# Patient Record
Sex: Male | Born: 1995 | Race: White | Hispanic: No | Marital: Single | Smoking: Former smoker
Health system: Southern US, Community
[De-identification: ages and names within clinical notes are randomized; demographics above are authoritative.]

## PROBLEM LIST (undated history)

## (undated) DIAGNOSIS — F419 Anxiety disorder, unspecified: Secondary | ICD-10-CM

## (undated) DIAGNOSIS — F32A Depression, unspecified: Secondary | ICD-10-CM

## (undated) DIAGNOSIS — F329 Major depressive disorder, single episode, unspecified: Secondary | ICD-10-CM

## (undated) DIAGNOSIS — F319 Bipolar disorder, unspecified: Secondary | ICD-10-CM

## (undated) HISTORY — DX: Bipolar disorder, unspecified: F31.9

## (undated) HISTORY — DX: Depression, unspecified: F32.A

## (undated) HISTORY — PX: KNEE SURGERY: SHX244

## (undated) HISTORY — DX: Anxiety disorder, unspecified: F41.9

## (undated) HISTORY — DX: Major depressive disorder, single episode, unspecified: F32.9

---
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
## 2017-07-02 ENCOUNTER — Emergency Department (HOSPITAL_BASED_OUTPATIENT_CLINIC_OR_DEPARTMENT_OTHER)
Admission: EM | Admit: 2017-07-02 | Discharge: 2017-07-02 | Disposition: A | Discharge status: Home / Self Care | Payer: BLUE CROSS/BLUE SHIELD | Attending: Emergency Medicine | Admitting: Emergency Medicine

## 2017-07-02 ENCOUNTER — Other Ambulatory Visit: Payer: Self-pay

## 2017-07-02 ENCOUNTER — Encounter (HOSPITAL_BASED_OUTPATIENT_CLINIC_OR_DEPARTMENT_OTHER): Payer: Self-pay | Admitting: Emergency Medicine

## 2017-07-02 ENCOUNTER — Emergency Department (HOSPITAL_BASED_OUTPATIENT_CLINIC_OR_DEPARTMENT_OTHER): Payer: BLUE CROSS/BLUE SHIELD

## 2017-07-02 DIAGNOSIS — Z87891 Personal history of nicotine dependence: Secondary | ICD-10-CM | POA: Diagnosis not present

## 2017-07-02 DIAGNOSIS — R112 Nausea with vomiting, unspecified: Secondary | ICD-10-CM | POA: Insufficient documentation

## 2017-07-02 DIAGNOSIS — R1013 Epigastric pain: Secondary | ICD-10-CM | POA: Diagnosis not present

## 2017-07-02 LAB — CBC
HCT: 44.6 % (ref 39.0–52.0)
Hemoglobin: 15.2 g/dL (ref 13.0–17.0)
MCH: 29.1 pg (ref 26.0–34.0)
MCHC: 34.1 g/dL (ref 30.0–36.0)
MCV: 85.3 fL (ref 78.0–100.0)
PLATELETS: 344 10*3/uL (ref 150–400)
RBC: 5.23 MIL/uL (ref 4.22–5.81)
RDW: 13.3 % (ref 11.5–15.5)
WBC: 6.3 10*3/uL (ref 4.0–10.5)

## 2017-07-02 LAB — URINALYSIS, MICROSCOPIC (REFLEX): RBC / HPF: NONE SEEN RBC/hpf (ref 0–5)

## 2017-07-02 LAB — URINALYSIS, ROUTINE W REFLEX MICROSCOPIC
Bilirubin Urine: NEGATIVE
GLUCOSE, UA: NEGATIVE mg/dL
Hgb urine dipstick: NEGATIVE
KETONES UR: NEGATIVE mg/dL
Leukocytes, UA: NEGATIVE
NITRITE: NEGATIVE
Protein, ur: 30 mg/dL — AB
Specific Gravity, Urine: 1.01 (ref 1.005–1.030)
pH: 8.5 — ABNORMAL HIGH (ref 5.0–8.0)

## 2017-07-02 LAB — COMPREHENSIVE METABOLIC PANEL
ALT: 12 U/L — AB (ref 17–63)
AST: 16 U/L (ref 15–41)
Albumin: 4.9 g/dL (ref 3.5–5.0)
Alkaline Phosphatase: 85 U/L (ref 38–126)
Anion gap: 9 (ref 5–15)
BILIRUBIN TOTAL: 0.7 mg/dL (ref 0.3–1.2)
BUN: 9 mg/dL (ref 6–20)
CO2: 26 mmol/L (ref 22–32)
CREATININE: 0.95 mg/dL (ref 0.61–1.24)
Calcium: 9.2 mg/dL (ref 8.9–10.3)
Chloride: 104 mmol/L (ref 101–111)
Glucose, Bld: 88 mg/dL (ref 65–99)
Potassium: 4.1 mmol/L (ref 3.5–5.1)
Sodium: 139 mmol/L (ref 135–145)
TOTAL PROTEIN: 7.6 g/dL (ref 6.5–8.1)

## 2017-07-02 LAB — LIPASE, BLOOD: Lipase: 19 U/L (ref 11–51)

## 2017-07-02 MED ORDER — SUCRALFATE 1 GM/10ML PO SUSP: 1.0000 g | Freq: Three times a day (TID) | ORAL | 0 refills | Status: AC

## 2017-07-02 MED ORDER — FAMOTIDINE 20 MG PO TABS
20.0000 mg | ORAL_TABLET | Freq: Once | ORAL | Status: AC
  Administered 2017-07-02: 20 mg via ORAL
  Filled 2017-07-02: qty 1

## 2017-07-02 MED ORDER — PANTOPRAZOLE SODIUM 40 MG IV SOLR
40.0000 mg | Freq: Once | INTRAVENOUS | Status: DC
Start: 2017-07-02 — End: 2017-07-02

## 2017-07-02 MED ORDER — ONDANSETRON 4 MG PO TBDP: 4.0000 mg | ORAL_TABLET | Freq: Three times a day (TID) | ORAL | 0 refills | Status: AC | PRN

## 2017-07-02 MED ORDER — PANTOPRAZOLE SODIUM 20 MG PO TBEC: 20.0000 mg | DELAYED_RELEASE_TABLET | Freq: Every day | ORAL | 0 refills | Status: AC

## 2017-07-02 MED ORDER — FAMOTIDINE IN NACL 20-0.9 MG/50ML-% IV SOLN: 20.0000 mg | Freq: Once | INTRAVENOUS | Status: DC

## 2017-07-02 MED ORDER — GI COCKTAIL ~~LOC~~
30.0000 mL | Freq: Once | ORAL | Status: AC
  Administered 2017-07-02: 30 mL via ORAL
  Filled 2017-07-02: qty 30

## 2017-07-02 MED ORDER — PANTOPRAZOLE SODIUM 40 MG PO TBEC
40.0000 mg | DELAYED_RELEASE_TABLET | Freq: Once | ORAL | Status: AC
  Administered 2017-07-02: 40 mg via ORAL
  Filled 2017-07-02: qty 1

## 2017-07-02 MED ORDER — ONDANSETRON 4 MG PO TBDP
4.0000 mg | ORAL_TABLET | Freq: Once | ORAL | Status: AC
  Administered 2017-07-02: 4 mg via ORAL
  Filled 2017-07-02: qty 1

## 2017-07-02 NOTE — ED Notes (Signed)
Refuses IV access. EDP notified.

## 2017-07-02 NOTE — Discharge Instructions (Signed)
Your abdominal pain is likely from gastritis, reflux or a stomach ulcer. You will need to take the prescribed proton pump inhibitor as directed, and avoid spicy/fatty/acidic foods. Avoid laying down flat within 30 minutes of eating. Avoid NSAIDs like ibuprofen or Aleve on an empty stomach. Use zofran as needed for nausea. Follow up with the gastroenterologist (GI doctor) listed for ongoing evaluation of your abdominal pain. Return to the ER for new or worsening symptoms, any additional concers. ° ° °SEEK IMMEDIATE MEDICAL ATTENTION IF YOU DEVELOP ANY OF THE FOLLOWING SYMPTOMS: °The pain does not go away or becomes severe.  °A temperature above 101 develops.  °Repeated vomiting occurs (multiple episodes).  °Blood is being passed in stools or vomit (bright red or black tarry stools).  °Return also if you develop chest pain, difficulty breathing, dizziness or fainting ° °

## 2017-07-02 NOTE — ED Provider Notes (Signed)
[facility] EMERGENCY DEPARTMENT Provider Note   CSN: [id number] Arrival date & time: 07/02/17  1425     History   Chief Complaint Chief Complaint  Patient presents with  . Abdominal Pain    HPI Carlos Ochoa is a 22 y.o. male.  HPI 22 year old male past medical history significant for anxiety, bipolar disorder, depression presents emergency department today for evaluation of abdominal cramping, vomiting.  Patient states that he has been having generalized abdominal cramping for the past 2 weeks that are intermittent.  Food makes the pain worse.  Patient states that this morning he was at work and had an episode of vomiting with blood in it.  Patient denies any further episodes of vomiting today.  Denies any associated diarrhea or constipation.  No history of same.  Patient has been taking over-the-counter analgesics such as Tylenol Motrin for the pain with some relief.  Patient denies any urinary symptoms, fevers, recent travel, bloody stools.  Last bowel movement was 2 days ago.  He does report passing gas.  Patient has not taking medications for his symptoms prior to arrival.  Denies any associated testicular pain or swelling.  Denies any chronic NSAID use.  Patient does endorse drinking last night.  Pt denies any fever, chill, ha, vision changes, lightheadedness, dizziness, congestion, neck pain, cp, sob, cough,urinary symptoms,  melena, hematochezia, lower extremity paresthesias.  Past Medical History:  Diagnosis Date  . Anxiety   . Bipolar disorder (HCC)   . Depression     There are no active problems to display for this patient.   Past Surgical History:  Procedure Laterality Date  . KNEE SURGERY         Home Medications    Prior to Admission medications   Not on File    Family History Family History  Problem Relation Age of Onset  . Anxiety disorder Mother   . Bipolar disorder Mother   . Depression Mother     Social History Social History    Tobacco Use  . Smoking status: Former Smoker    Types: Cigarettes    Last attempt to quit: 09/30/2014    Years since quitting: 2.7  . Smokeless tobacco: Never Used  Substance Use Topics  . Alcohol use: No  . Drug use: Yes    Types: Marijuana     Allergies   Patient has no known allergies.   Review of Systems Review of Systems  Constitutional: Negative for chills and fever.  HENT: Negative for congestion and sore throat.   Eyes: Negative for visual disturbance.  Respiratory: Negative for cough and shortness of breath.   Cardiovascular: Negative for chest pain.  Gastrointestinal: Positive for abdominal pain, nausea and vomiting. Negative for blood in stool, constipation and diarrhea.  Genitourinary: Negative for dysuria, flank pain, frequency, hematuria, scrotal swelling, testicular pain and urgency.  Musculoskeletal: Negative for arthralgias and myalgias.  Skin: Negative for rash.  Neurological: Negative for dizziness, syncope, weakness, light-headedness, numbness and headaches.  Psychiatric/Behavioral: Negative for sleep disturbance. The patient is not nervous/anxious.      Physical Exam Updated Vital Signs BP 137/78 (BP Location: Right Arm)   Pulse 72   Temp 98.2 F (36.8 C) (Oral)   Resp 18   Ht 5\' 10"  (1.778 m)   Wt 68 kg (150 lb)   SpO2 98%   BMI 21.52 kg/m   Physical Exam  Constitutional: He is oriented to person, place, and time. He appears well-developed and well-nourished.  Non-toxic appearance. No  distress.  HENT:  Head: Normocephalic and atraumatic.  Mouth/Throat: Oropharynx is clear and moist.  Eyes: Conjunctivae are normal. Pupils are equal, round, and reactive to light. Right eye exhibits no discharge. Left eye exhibits no discharge.  Neck: Normal range of motion. Neck supple.  Cardiovascular: Normal rate, regular rhythm, normal heart sounds and intact distal pulses. Exam reveals no gallop and no friction rub.  No murmur heard. Pulmonary/Chest:  Effort normal and breath sounds normal. No respiratory distress. He exhibits no tenderness.  Abdominal: Soft. Bowel sounds are normal. He exhibits no distension. There is tenderness in the epigastric area and left upper quadrant. There is no rigidity, no rebound, no guarding, no CVA tenderness, no tenderness at McBurney's point and negative Murphy's sign.  Musculoskeletal: Normal range of motion. He exhibits no tenderness.  Lymphadenopathy:    He has no cervical adenopathy.  Neurological: He is alert and oriented to person, place, and time.  Skin: Skin is warm and dry. Capillary refill takes less than 2 seconds. No rash noted.  Psychiatric: His behavior is normal. Judgment and thought content normal.  Nursing note and vitals reviewed.    ED Treatments / Results  Labs (all labs ordered are listed, but only abnormal results are displayed) Labs Reviewed  COMPREHENSIVE METABOLIC PANEL - Abnormal; Notable for the following components:      Result Value   ALT 12 (*)    All other components within normal limits  URINALYSIS, ROUTINE W REFLEX MICROSCOPIC - Abnormal; Notable for the following components:   pH 8.5 (*)    Protein, ur 30 (*)    All other components within normal limits  URINALYSIS, MICROSCOPIC (REFLEX) - Abnormal; Notable for the following components:   Bacteria, UA RARE (*)    Squamous Epithelial / LPF 0-5 (*)    All other components within normal limits  LIPASE, BLOOD  CBC    EKG  EKG Interpretation None       Radiology No results found.  Procedures Procedures (including critical care time)  Medications Ordered in ED Medications  ondansetron (ZOFRAN-ODT) disintegrating tablet 4 mg (4 mg Oral Given 07/02/17 1447)  gi cocktail (Maalox,Lidocaine,Donnatal) (30 mLs Oral Given 07/02/17 1952)  famotidine (PEPCID) tablet 20 mg (20 mg Oral Given 07/02/17 1950)  pantoprazole (PROTONIX) EC tablet 40 mg (40 mg Oral Given 07/02/17 1950)     Initial Impression / Assessment  and Plan / ED Course  I have reviewed the triage vital signs and the nursing notes.  Pertinent labs & imaging results that were available during my care of the patient were reviewed by me and considered in my medical decision making (see chart for details).     Patient with symptoms consistent with viral gastroenteritis versus PUD versus alcoholic gastritis.  Vitals are stable, no fever.  No signs of dehydration, tolerating PO fluids > 6 oz.  Lungs are clear.  No focal abdominal pain, no concern for appendicitis, cholecystitis, pancreatitis, ruptured viscus, UTI, kidney stone, or any other abdominal etiology.  Work reassuring.  No leukocytosis.  Normal liver enzymes.  Normal lipase.  UA shows no signs of infection.  Normal kidney function.  I offered patient GI cocktail, IV Pepcid, Protonix.  Patient refused all IV medications.  He refused imaging.  Patient wants to be discharged at this time.  States that he may benefit from further workup here or with GI.  Patient states that he would rather go home and will follow with the GI doctor in the outpatient setting.  Supportive therapy indicated with return if symptoms worsen.   Pt is hemodynamically stable, in NAD, & able to ambulate in the ED. Evaluation does not show pathology that would require ongoing emergent intervention or inpatient treatment. I explained the diagnosis to the patient. Pain has been managed & has no complaints prior to dc. Pt is comfortable with above plan and is stable for discharge at this time. All questions were answered prior to disposition. Strict return precautions for f/u to the ED were discussed. Encouraged follow up with PCP.    Final Clinical Impressions(s) / ED Diagnoses   Final diagnoses:  Epigastric abdominal pain  Non-intractable vomiting with nausea, unspecified vomiting type    ED Discharge Orders        Ordered    pantoprazole (PROTONIX) 20 MG tablet  Daily     07/02/17 2022    sucralfate (CARAFATE) 1  GM/10ML suspension  3 times daily with meals & bedtime     07/02/17 2022    ondansetron (ZOFRAN ODT) 4 MG disintegrating tablet  Every 8 hours PRN     07/02/17 2022       Wallace Keller 07/04/17 0204    Terrilee Files, MD 07/05/17 [phone]

## 2017-07-02 NOTE — ED Triage Notes (Signed)
Intermittent abd cramping x 2 weeks. Pt states he has been vomiting today.

## 2017-07-02 NOTE — ED Notes (Signed)
EMT called patient to re vital. EMT waited about 10 mins before patient came in to the traige room. Patient stated " My heart rate is going to be high, I just ran in here because I drove to my moms house to eat lunch with her". EMT asked patient if he still had abdominal pain. Patient stated " Yes, it hurts bad".

## 2019-10-23 ENCOUNTER — Emergency Department (HOSPITAL_COMMUNITY): Payer: BC Managed Care – PPO

## 2019-10-23 ENCOUNTER — Encounter (HOSPITAL_COMMUNITY): Payer: Self-pay

## 2019-10-23 ENCOUNTER — Emergency Department (HOSPITAL_COMMUNITY)
Admission: EM | Admit: 2019-10-23 | Discharge: 2019-10-23 | Disposition: A | Discharge status: Home / Self Care | Payer: BC Managed Care – PPO | Attending: Emergency Medicine | Admitting: Emergency Medicine

## 2019-10-23 DIAGNOSIS — S60512A Abrasion of left hand, initial encounter: Secondary | ICD-10-CM | POA: Diagnosis not present

## 2019-10-23 DIAGNOSIS — S62111A Displaced fracture of triquetrum [cuneiform] bone, right wrist, initial encounter for closed fracture: Secondary | ICD-10-CM | POA: Diagnosis not present

## 2019-10-23 DIAGNOSIS — Z87891 Personal history of nicotine dependence: Secondary | ICD-10-CM | POA: Insufficient documentation

## 2019-10-23 DIAGNOSIS — S42002A Fracture of unspecified part of left clavicle, initial encounter for closed fracture: Secondary | ICD-10-CM | POA: Insufficient documentation

## 2019-10-23 DIAGNOSIS — S60511A Abrasion of right hand, initial encounter: Secondary | ICD-10-CM | POA: Insufficient documentation

## 2019-10-23 DIAGNOSIS — S62344A Nondisplaced fracture of base of fourth metacarpal bone, right hand, initial encounter for closed fracture: Secondary | ICD-10-CM | POA: Insufficient documentation

## 2019-10-23 DIAGNOSIS — Y939 Activity, unspecified: Secondary | ICD-10-CM | POA: Insufficient documentation

## 2019-10-23 DIAGNOSIS — M546 Pain in thoracic spine: Secondary | ICD-10-CM | POA: Insufficient documentation

## 2019-10-23 DIAGNOSIS — Z23 Encounter for immunization: Secondary | ICD-10-CM | POA: Insufficient documentation

## 2019-10-23 DIAGNOSIS — S20219A Contusion of unspecified front wall of thorax, initial encounter: Secondary | ICD-10-CM | POA: Insufficient documentation

## 2019-10-23 DIAGNOSIS — S62346A Nondisplaced fracture of base of fifth metacarpal bone, right hand, initial encounter for closed fracture: Secondary | ICD-10-CM | POA: Diagnosis not present

## 2019-10-23 DIAGNOSIS — R040 Epistaxis: Secondary | ICD-10-CM | POA: Diagnosis not present

## 2019-10-23 DIAGNOSIS — Y999 Unspecified external cause status: Secondary | ICD-10-CM | POA: Diagnosis not present

## 2019-10-23 DIAGNOSIS — Y929 Unspecified place or not applicable: Secondary | ICD-10-CM | POA: Diagnosis not present

## 2019-10-23 DIAGNOSIS — S62141A Displaced fracture of body of hamate [unciform] bone, right wrist, initial encounter for closed fracture: Secondary | ICD-10-CM | POA: Diagnosis not present

## 2019-10-23 DIAGNOSIS — T07XXXA Unspecified multiple injuries, initial encounter: Secondary | ICD-10-CM | POA: Diagnosis present

## 2019-10-23 DIAGNOSIS — T1490XA Injury, unspecified, initial encounter: Secondary | ICD-10-CM

## 2019-10-23 LAB — CBC WITH DIFFERENTIAL/PLATELET
Abs Immature Granulocytes: 0.05 10*3/uL (ref 0.00–0.07)
Basophils Absolute: 0 10*3/uL (ref 0.0–0.1)
Basophils Relative: 0 %
Eosinophils Absolute: 0 10*3/uL (ref 0.0–0.5)
Eosinophils Relative: 0 %
HCT: 46.1 % (ref 39.0–52.0)
Hemoglobin: 15.3 g/dL (ref 13.0–17.0)
Immature Granulocytes: 1 %
Lymphocytes Relative: 11 %
Lymphs Abs: 1.1 10*3/uL (ref 0.7–4.0)
MCH: 29.5 pg (ref 26.0–34.0)
MCHC: 33.2 g/dL (ref 30.0–36.0)
MCV: 89 fL (ref 80.0–100.0)
Monocytes Absolute: 1 10*3/uL (ref 0.1–1.0)
Monocytes Relative: 10 %
Neutro Abs: 8.2 10*3/uL — ABNORMAL HIGH (ref 1.7–7.7)
Neutrophils Relative %: 78 %
Platelets: 307 10*3/uL (ref 150–400)
RBC: 5.18 MIL/uL (ref 4.22–5.81)
RDW: 12.6 % (ref 11.5–15.5)
WBC: 10.5 10*3/uL (ref 4.0–10.5)
nRBC: 0 % (ref 0.0–0.2)

## 2019-10-23 LAB — I-STAT CHEM 8, ED
BUN: 9 mg/dL (ref 6–20)
Calcium, Ion: 1.09 mmol/L — ABNORMAL LOW (ref 1.15–1.40)
Chloride: 109 mmol/L (ref 98–111)
Creatinine, Ser: 1.2 mg/dL (ref 0.61–1.24)
Glucose, Bld: 99 mg/dL (ref 70–99)
HCT: 45 % (ref 39.0–52.0)
Hemoglobin: 15.3 g/dL (ref 13.0–17.0)
Potassium: 3.5 mmol/L (ref 3.5–5.1)
Sodium: 148 mmol/L — ABNORMAL HIGH (ref 135–145)
TCO2: 21 mmol/L — ABNORMAL LOW (ref 22–32)

## 2019-10-23 LAB — COMPREHENSIVE METABOLIC PANEL
ALT: 24 U/L (ref 0–44)
AST: 34 U/L (ref 15–41)
Albumin: 4.7 g/dL (ref 3.5–5.0)
Alkaline Phosphatase: 70 U/L (ref 38–126)
Anion gap: 14 (ref 5–15)
BUN: 9 mg/dL (ref 6–20)
CO2: 20 mmol/L — ABNORMAL LOW (ref 22–32)
Calcium: 9.6 mg/dL (ref 8.9–10.3)
Chloride: 110 mmol/L (ref 98–111)
Creatinine, Ser: 1.04 mg/dL (ref 0.61–1.24)
GFR calc Af Amer: >60 mL/min (ref >60)
GFR calc non Af Amer: >60 mL/min (ref >60)
Glucose, Bld: 104 mg/dL — ABNORMAL HIGH (ref 70–99)
Potassium: 3.5 mmol/L (ref 3.5–5.1)
Sodium: 144 mmol/L (ref 135–145)
Total Bilirubin: 0.5 mg/dL (ref 0.3–1.2)
Total Protein: 7.4 g/dL (ref 6.5–8.1)

## 2019-10-23 LAB — SAMPLE TO BLOOD BANK

## 2019-10-23 MED ORDER — TETANUS-DIPHTH-ACELL PERTUSSIS 5-2.5-18.5 LF-MCG/0.5 IM SUSP
0.5000 mL | Freq: Once | INTRAMUSCULAR | Status: AC
  Administered 2019-10-23: 0.5 mL via INTRAMUSCULAR
  Filled 2019-10-23: qty 0.5

## 2019-10-23 MED ORDER — MORPHINE SULFATE (PF) 4 MG/ML IV SOLN
4.0000 mg | Freq: Once | INTRAVENOUS | Status: AC
  Administered 2019-10-23: 4 mg via INTRAVENOUS
  Filled 2019-10-23: qty 1

## 2019-10-23 MED ORDER — CYCLOBENZAPRINE HCL 10 MG PO TABS
10.0000 mg | ORAL_TABLET | Freq: Once | ORAL | Status: AC
  Administered 2019-10-23: 10 mg via ORAL
  Filled 2019-10-23: qty 1

## 2019-10-23 MED ORDER — FENTANYL CITRATE (PF) 100 MCG/2ML IJ SOLN
50.0000 ug | Freq: Once | INTRAMUSCULAR | Status: AC
  Administered 2019-10-23: 50 ug via INTRAVENOUS
  Filled 2019-10-23: qty 2

## 2019-10-23 MED ORDER — HYDROCODONE-ACETAMINOPHEN 5-325 MG PO TABS: 1.0000 | ORAL_TABLET | Freq: Four times a day (QID) | ORAL | 0 refills | Status: AC | PRN

## 2019-10-23 MED ORDER — CYCLOBENZAPRINE HCL 10 MG PO TABS: 10.0000 mg | ORAL_TABLET | Freq: Two times a day (BID) | ORAL | 0 refills | Status: AC | PRN

## 2019-10-23 MED ORDER — IBUPROFEN 600 MG PO TABS: 600.0000 mg | ORAL_TABLET | Freq: Four times a day (QID) | ORAL | 0 refills | Status: AC | PRN

## 2019-10-23 MED ORDER — IOHEXOL 300 MG/ML  SOLN
100.0000 mL | Freq: Once | INTRAMUSCULAR | Status: AC | PRN
  Administered 2019-10-23: 100 mL via INTRAVENOUS
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
## 2019-10-23 NOTE — Discharge Instructions (Addendum)
1. Medications: Alternate 600 mg of ibuprofen and [phone] mg of Tylenol every 3-6 hours as needed for pain. Do not exceed 4000 mg of Tylenol daily.  Take ibuprofen with food to avoid upset stomach issues. You can take hydrocodone as needed for severe pain but do not drive, drink alcohol, or operate heavy machinery while taking this medicine as it can cause drowsiness.  Be aware this medicine also contains Tylenol and do not exceed more than 4000 mg of Tylenol daily.  You can take Flexeril as needed for muscle relaxation although this medicine generally just makes people drowsy.  The same precautions apply as with the hydrocodone including no driving, drinking alcohol or operating heavy machinery. 2. Treatment: rest, apply ice or heat, whichever feels best 20 minutes at a time a few times daily, elevate, keep the splint clean and dry, drink plenty of fluids, 3. Follow Up: Please followup with orthopedics as directed for discussion of your diagnoses and further evaluation after today's visit; call today to schedule follow-up appointment; Please return to the ER for worsening symptoms or other concerns such as worsening swelling, redness of the skin, severe headaches, persistent vomiting, shortness of breath or chest pain, fevers, loss of pulses, or loss of feeling

## 2019-10-23 NOTE — Progress Notes (Signed)
Orthopedic Tech Progress Note Patient Details:  Carlos Ochoa [birth year] [id number]  Ortho Devices Type of Ortho Device: Shoulder immobilizer, Ulna gutter splint Ortho Device/Splint Location: LUE, RUE Ortho Device/Splint Interventions: Ordered, Application, Adjustment   Post Interventions Patient Tolerated: Well Instructions Provided: Care of device   Donald Pore 10/23/2019, 9:17 AM

## 2019-10-23 NOTE — ED Notes (Signed)
Patient verbalizes understanding of discharge instructions. Opportunity for questioning and answers were provided. Armband removed by staff, pt discharged from ED.  

## 2019-10-24 ENCOUNTER — Ambulatory Visit
Admission: RE | Admit: 2019-10-24 | Discharge: 2019-10-24 | Disposition: A | Discharge status: Home / Self Care | Payer: BLUE CROSS/BLUE SHIELD | Source: Ambulatory Visit | Attending: Orthopedic Surgery | Admitting: Orthopedic Surgery

## 2019-10-24 ENCOUNTER — Other Ambulatory Visit: Payer: Self-pay | Admitting: Orthopedic Surgery

## 2019-10-24 ENCOUNTER — Ambulatory Visit
Admission: RE | Admit: 2019-10-24 | Discharge: 2019-10-24 | Disposition: A | Discharge status: Home / Self Care | Payer: BC Managed Care – PPO | Source: Ambulatory Visit | Attending: Orthopedic Surgery | Admitting: Orthopedic Surgery

## 2019-10-24 DIAGNOSIS — M79641 Pain in right hand: Secondary | ICD-10-CM

## 2021-08-19 IMAGING — DX DG WRIST COMPLETE 3+V*R*
4 series · 4 of 4 positions shown · non-contrast
Comparison: None.

CLINICAL DATA: Hand fracture

EXAM:
RIGHT WRIST - COMPLETE 3+ VIEW

[wrist ap (1 of 2)]
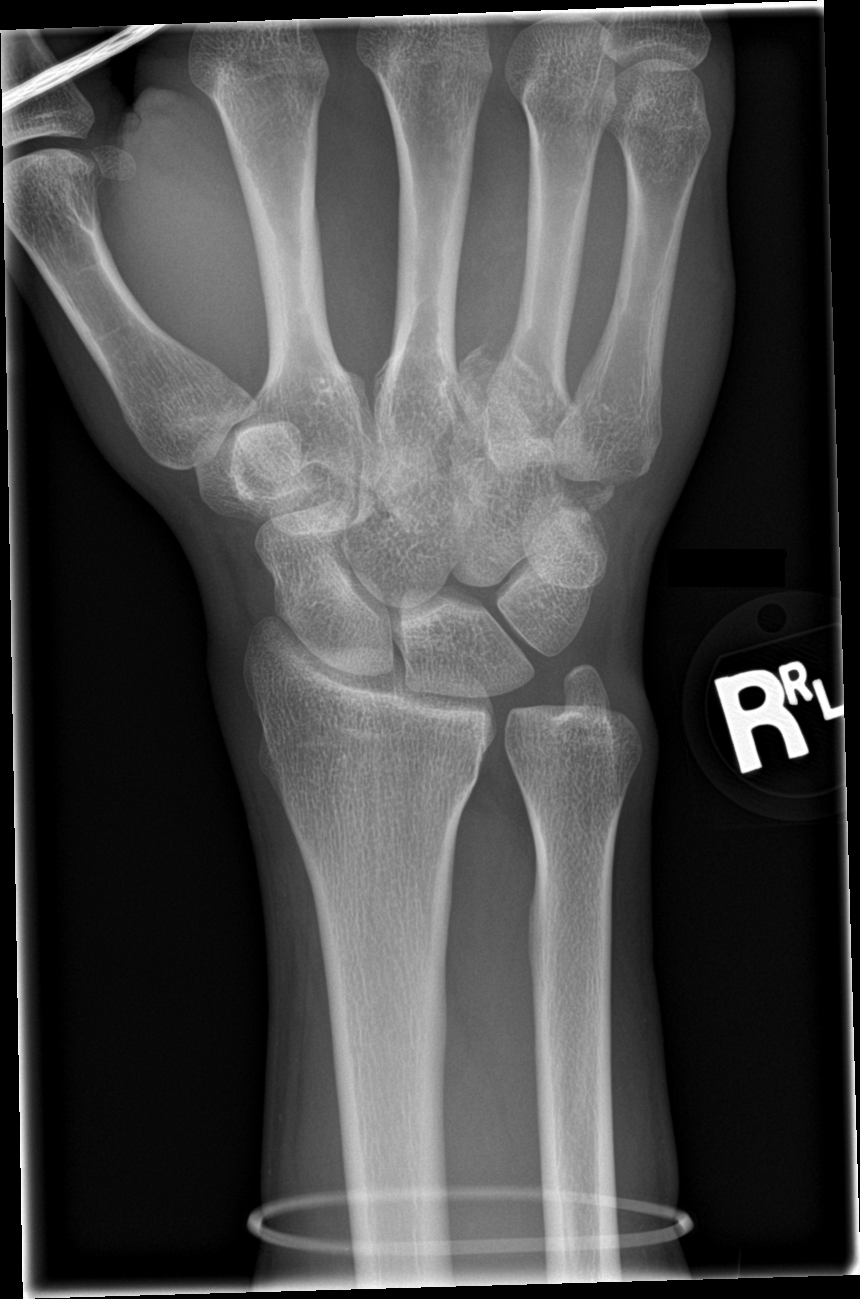

[wrist obl]
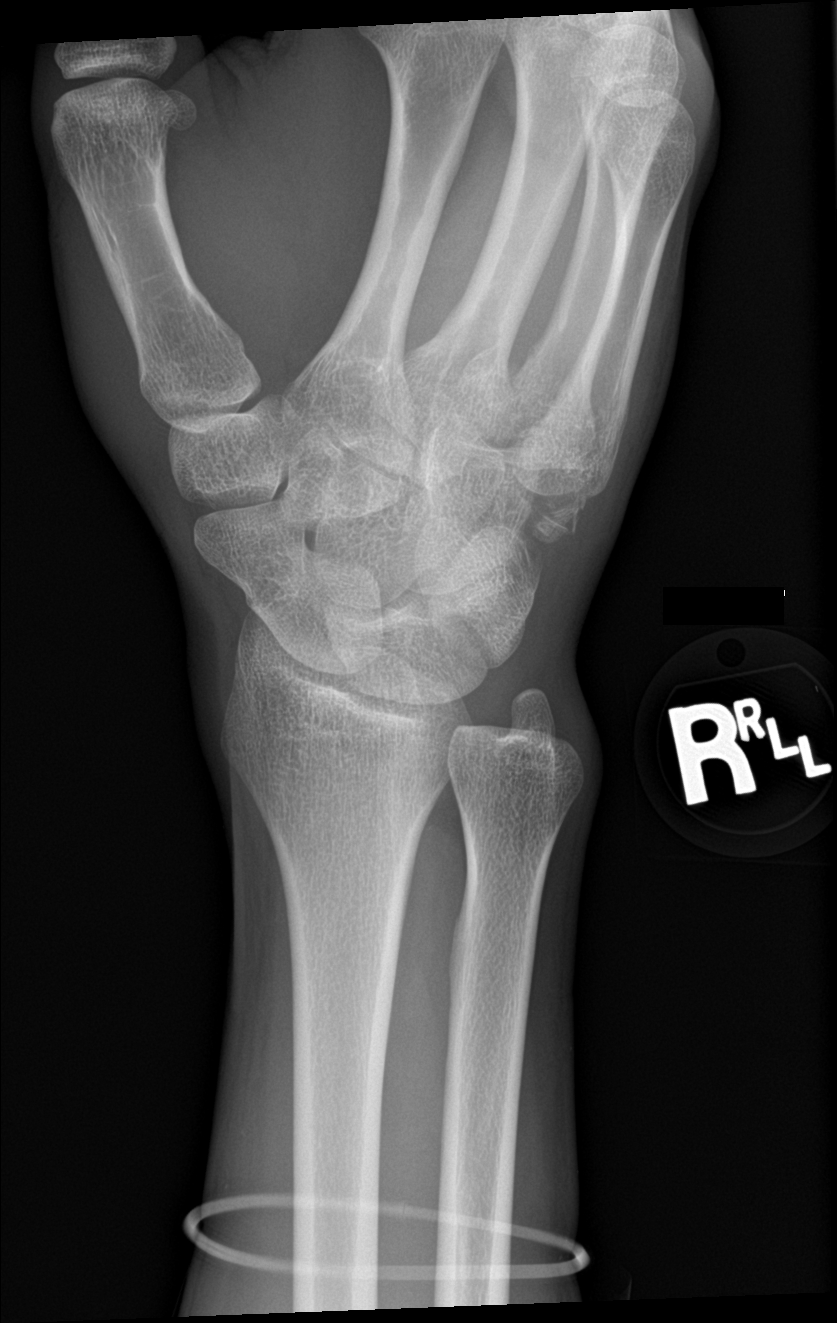

[wrist lat]
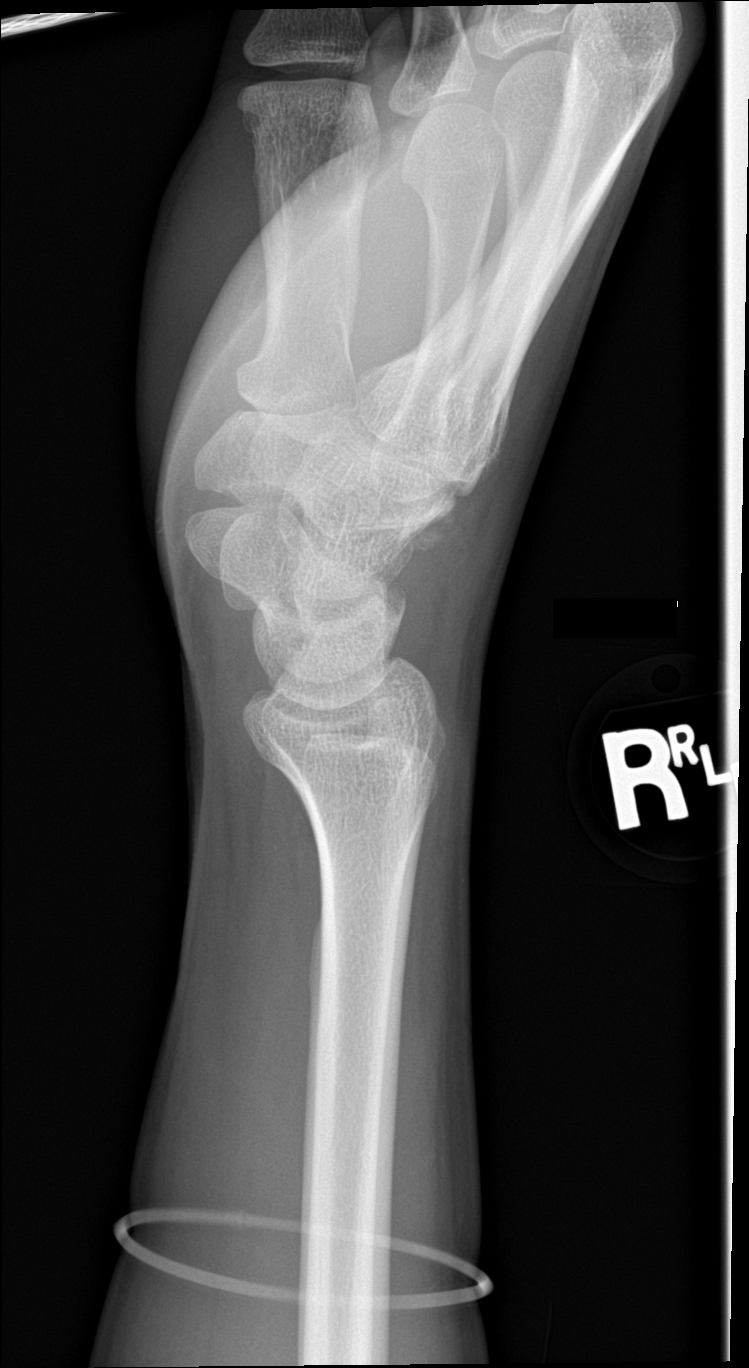

[wrist ap (2 of 2)]
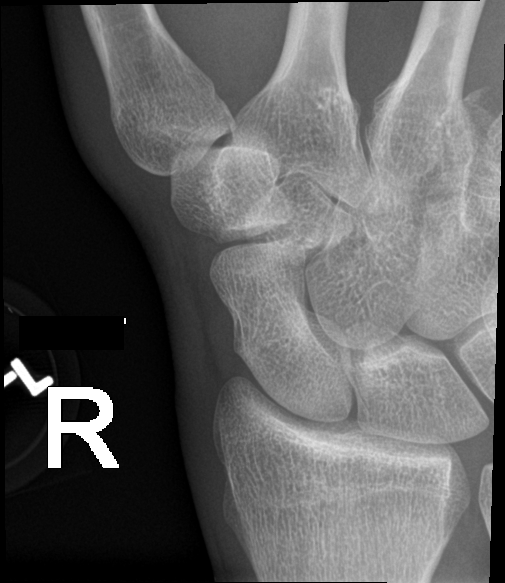

[4 of 4 positions shown; findings below may reference images not displayed]

FINDINGS: There is partially visualized fracture seen at the base of the
fourth metacarpal, base of the fifth metacarpal, dorsal surface of
the hamate and triquetrum. No other fractures are seen. Dorsal soft
tissue swelling is noted.
IMPRESSION: Mildly displaced fractures of the fourth metacarpal, fifth
metacarpal, triquetrum, and hamate as described above. Mild dorsal
soft tissue swelling.
